# Patient Record
Sex: Female | Born: 1985 | Hispanic: Yes | Marital: Single
Health system: Southern US, Community
[De-identification: ages and names within clinical notes are randomized; demographics above are authoritative.]

---
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
## 2007-06-18 ENCOUNTER — Emergency Department: Payer: Self-pay | Admitting: Emergency Medicine

## 2007-06-30 ENCOUNTER — Inpatient Hospital Stay: Payer: Self-pay | Admitting: Vascular Surgery

## 2008-04-28 ENCOUNTER — Ambulatory Visit: Payer: Self-pay | Admitting: Family Medicine

## 2008-05-28 IMAGING — US ABDOMEN ULTRASOUND
1 series · 17 of 25 positions shown · non-contrast
Comparison: none

REASON FOR EXAM: RUQ pain
COMMENTS:

[Series 1: abdomen ultrasound · 17 of 51 slices shown]
[im 1/51]
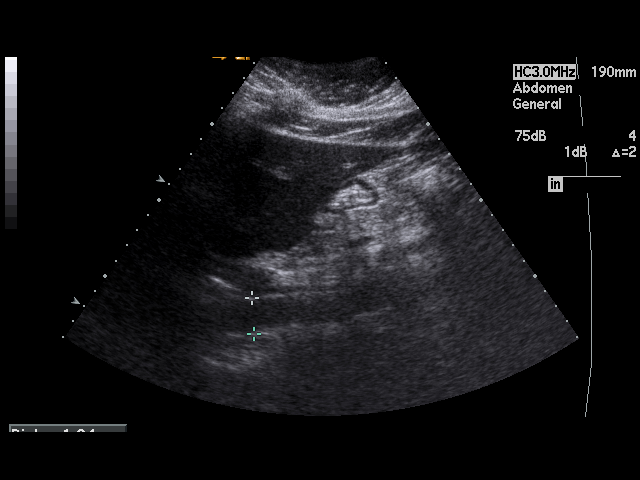
[im 5/51]
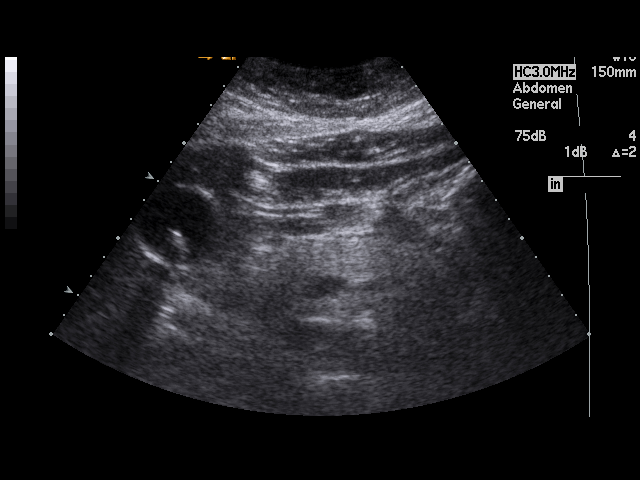
[im 7/51]
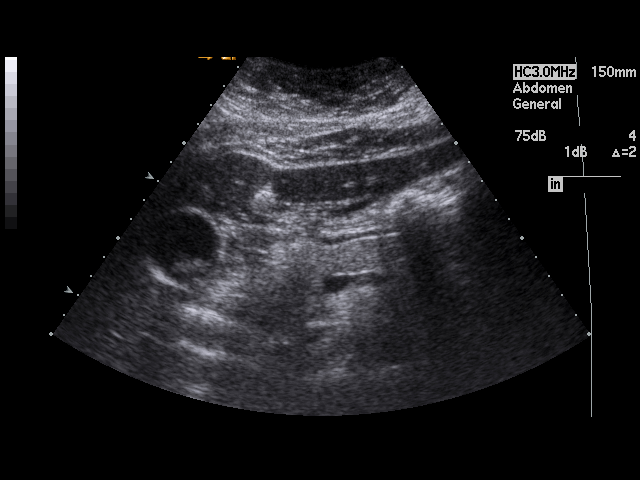
[im 11/51]
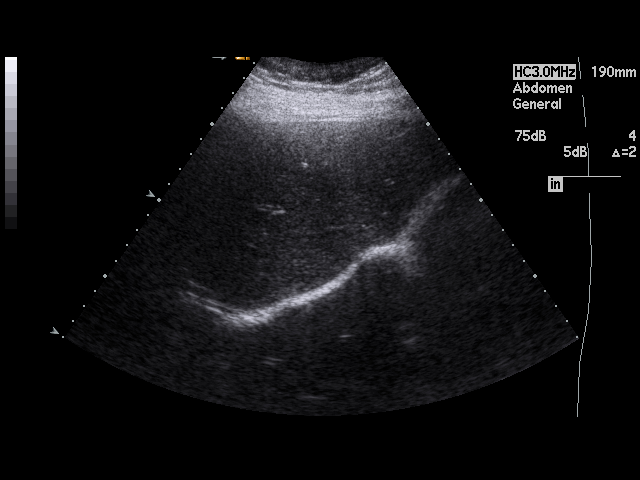
[im 13/51]
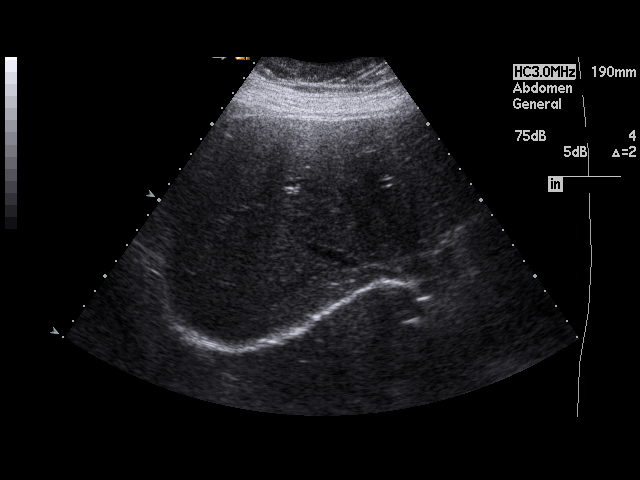
[im 17/51]
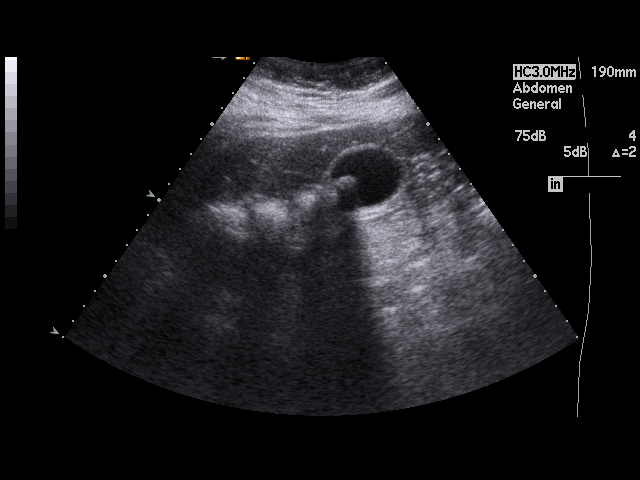
[im 19/51]
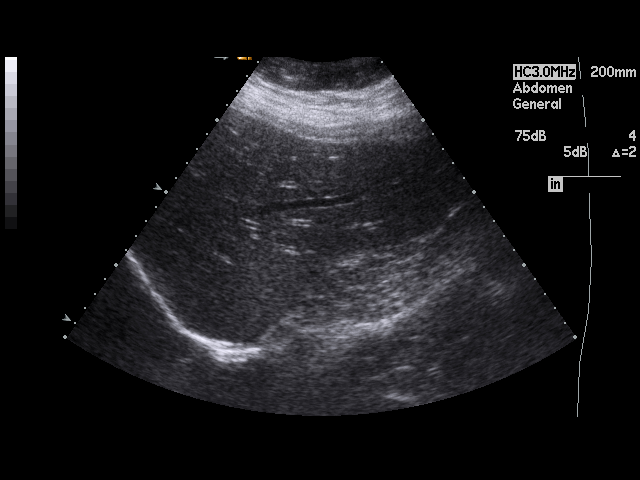
[im 23/51]
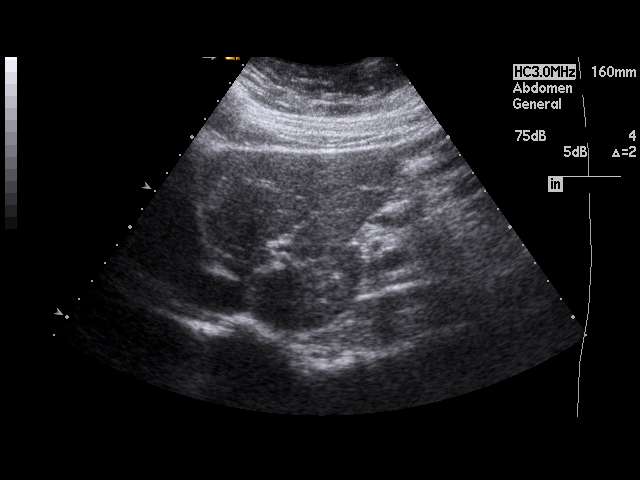
[im 26/51]
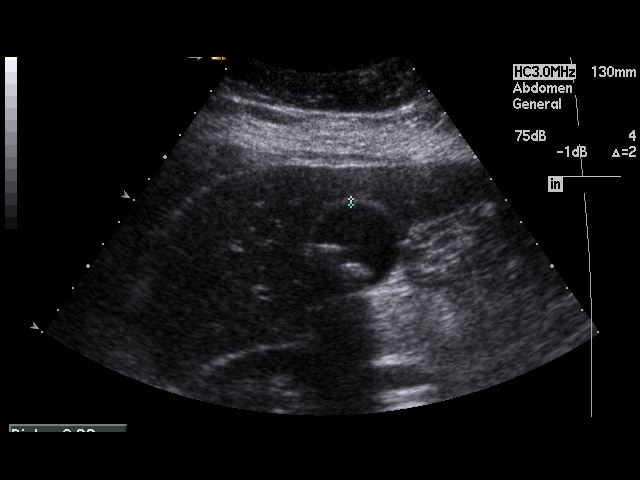
[im 28/51]
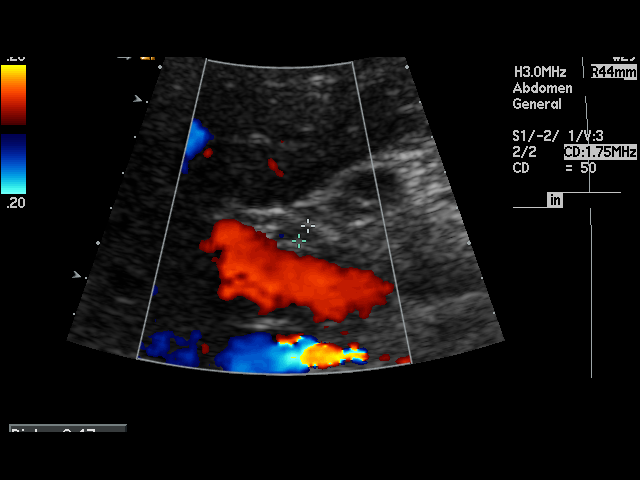
[im 32/51]
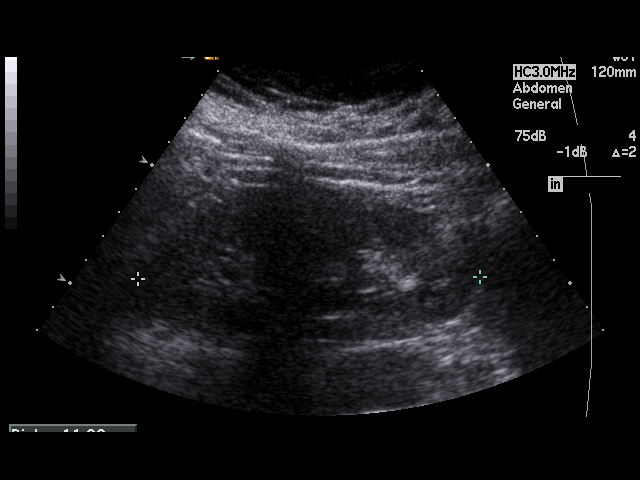
[im 34/51]
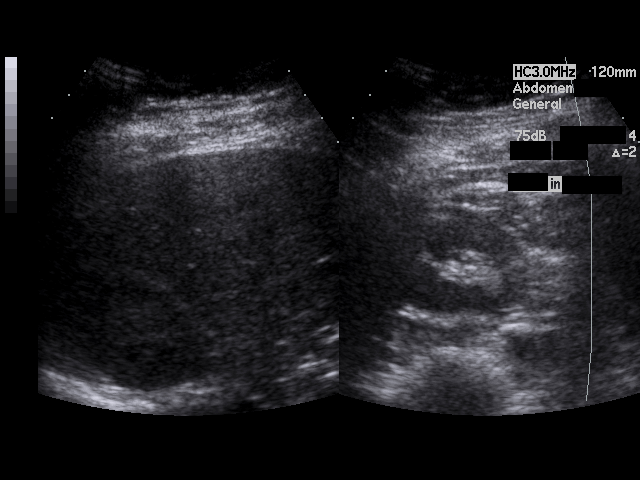
[im 38/51]
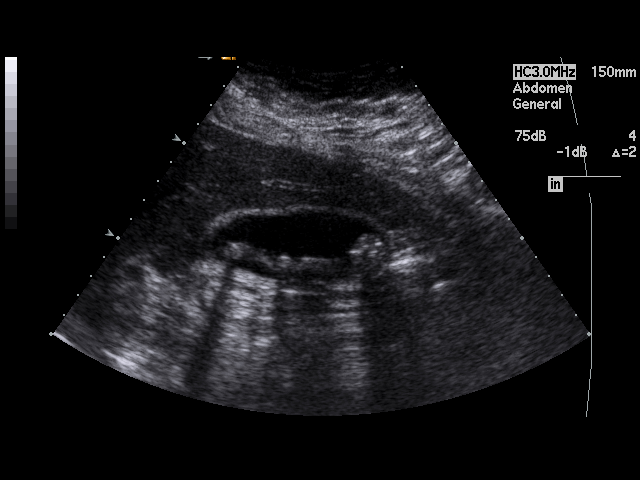
[im 40/51]
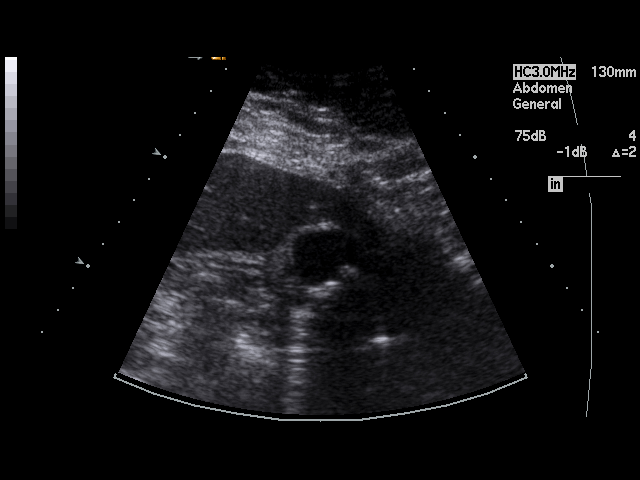
[im 44/51]
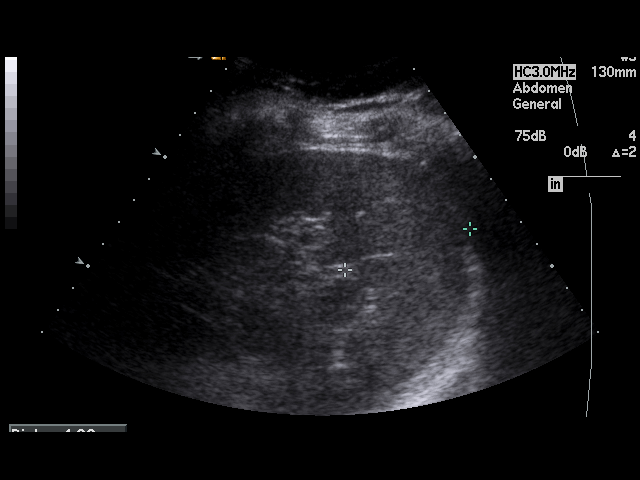
[im 46/51]
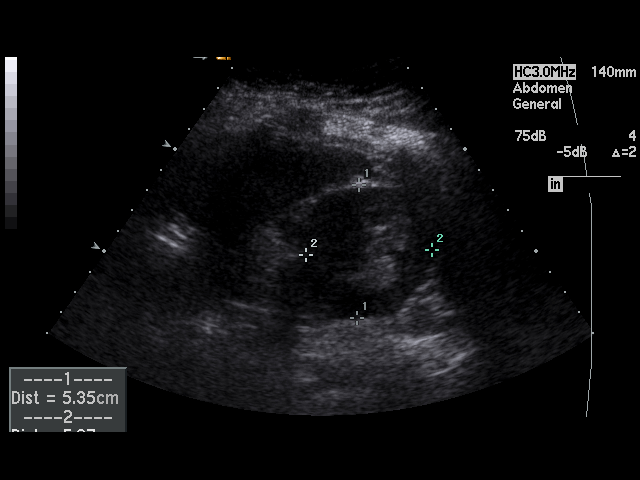
[im 51/51]
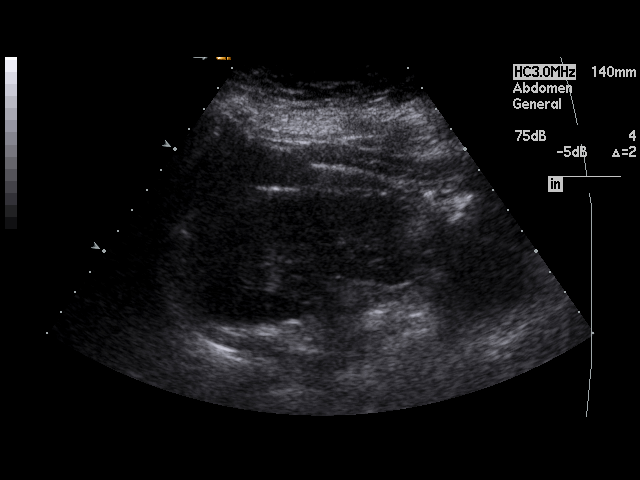

[17 of 25 positions shown; findings below may reference images not displayed]

PROCEDURE:     US  - US ABDOMEN GENERAL SURVEY  - June 18, 2007  [DATE]

RESULT:     There is no prior examination for comparison. The pancreas could
not be visualized because of excessive overlying bowel gas. The liver,
spleen and kidneys appear to be normal. The gallbladder has numerous
echogenic shadowing stones. The wall thickness is 2.2 mm. There is
reportedly a positive sonographic Murphy's sign. There is no pericholecystic
free fluid. The common bile duct diameter is 4.8 mm which is somewhat
prominent but near the upper limits of normal for the patient's age. The
possibility of a common bile duct stone without significant obstruction
cannot be completely excluded. Surgical consultation is suggested.
IMPRESSION: 1.     Cholelithiasis with a positive sonographic Murphy's sign. Surgical
consultation is recommended. The common bile duct is at the upper limits for
the patient's age and consideration for a common duct stone should be given.
There is no pericholecystic free fluid at this time.

## 2009-04-08 IMAGING — US US THYROID
1 series · 17 of 25 positions shown · non-contrast
Comparison: none

REASON FOR EXAM: thyromegaly
COMMENTS:

[Series 1: us thyroid · 17 of 26 slices shown]
[im 1/26]
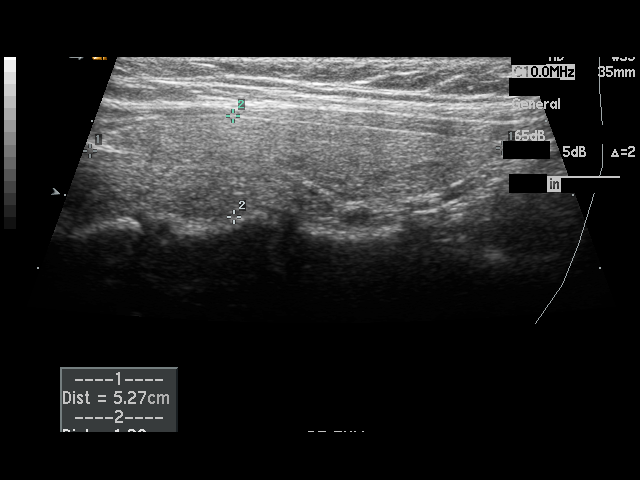
[im 3/26]
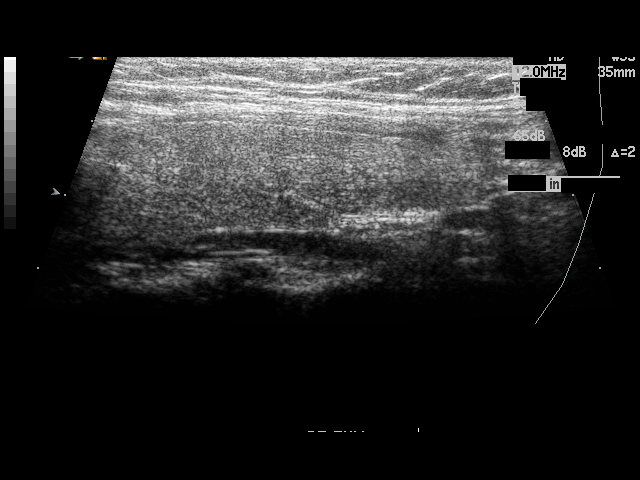
[im 4/26]
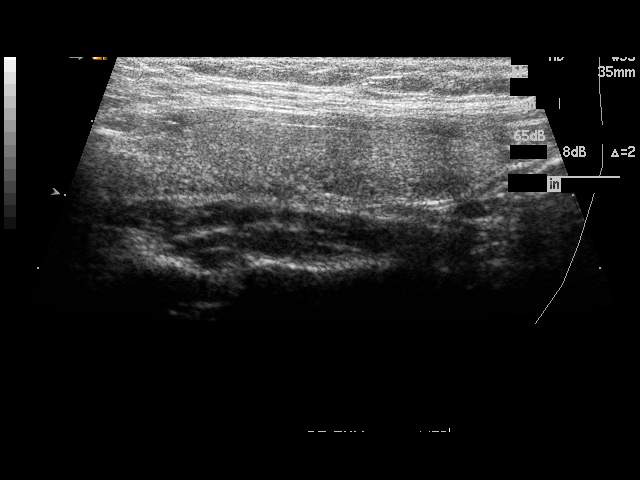
[im 6/26]
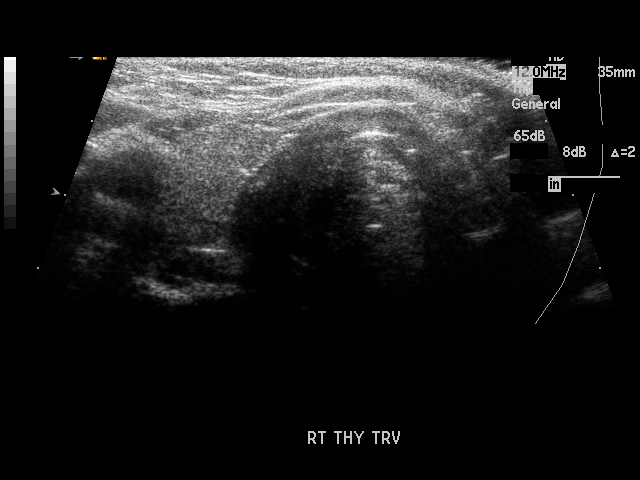
[im 7/26]
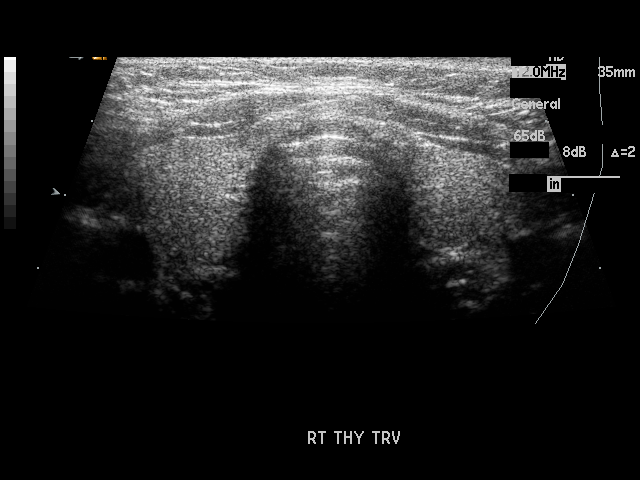
[im 9/26]
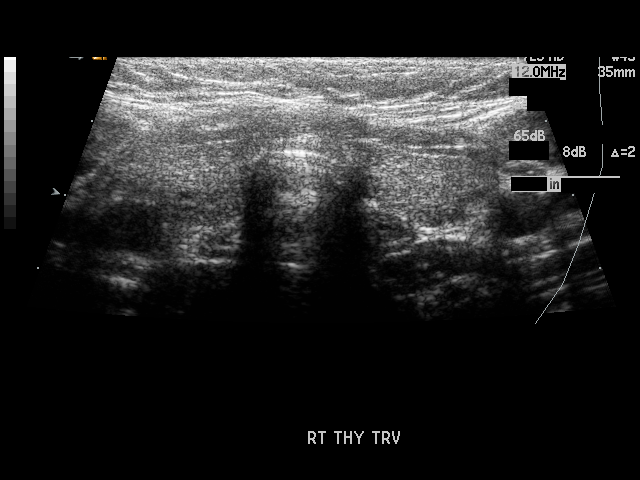
[im 10/26]
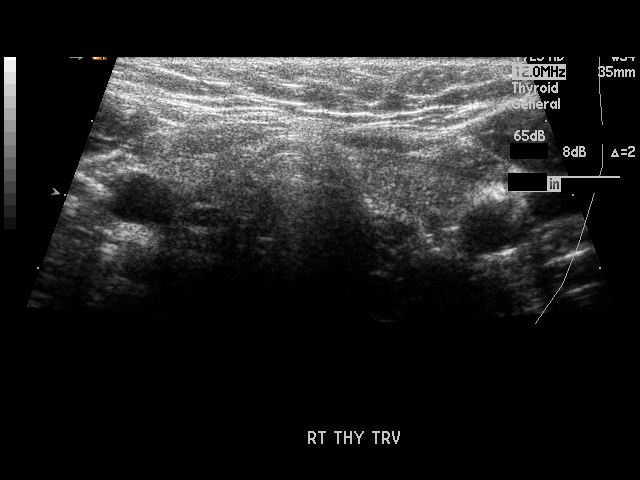
[im 12/26]
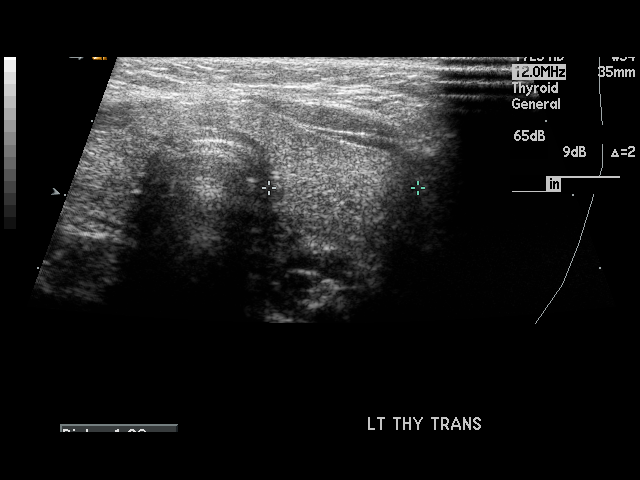
[im 13/26]
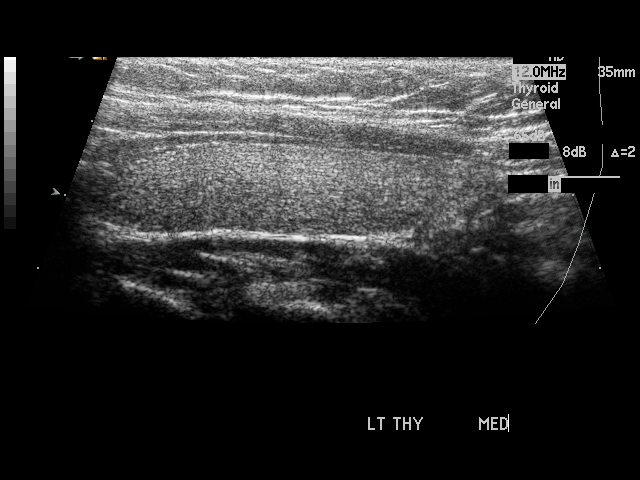
[im 14/26]
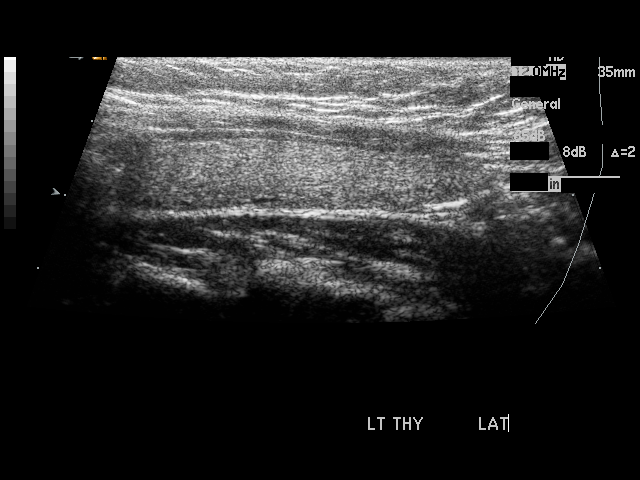
[im 16/26]
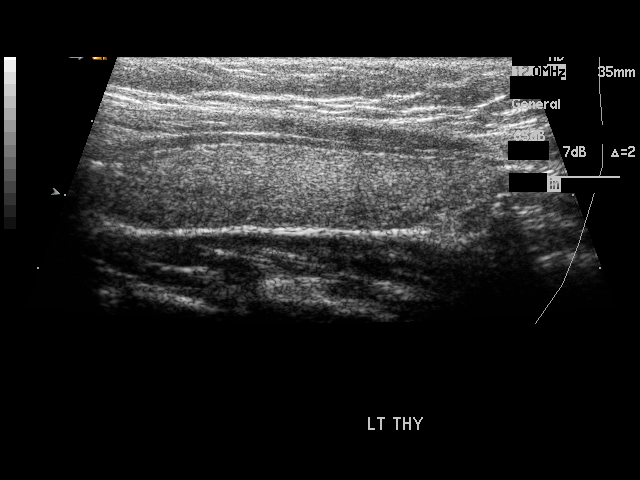
[im 17/26]
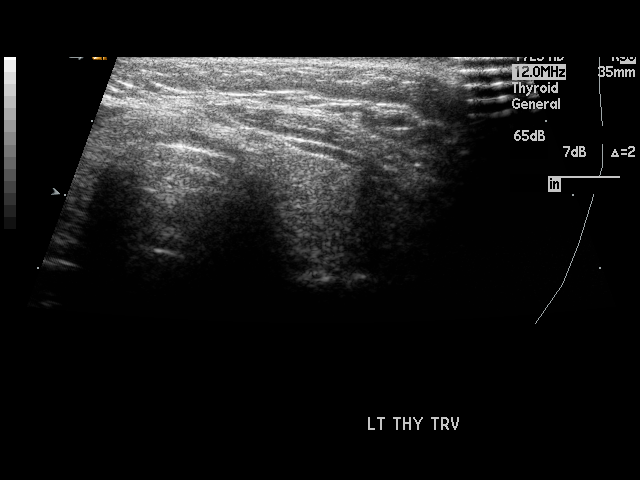
[im 19/26]
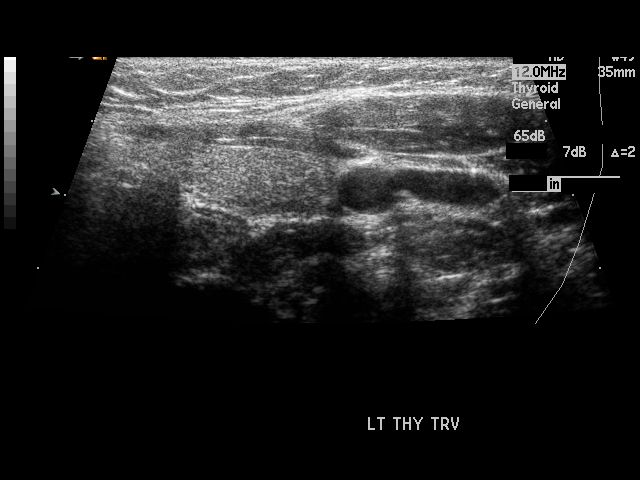
[im 20/26]
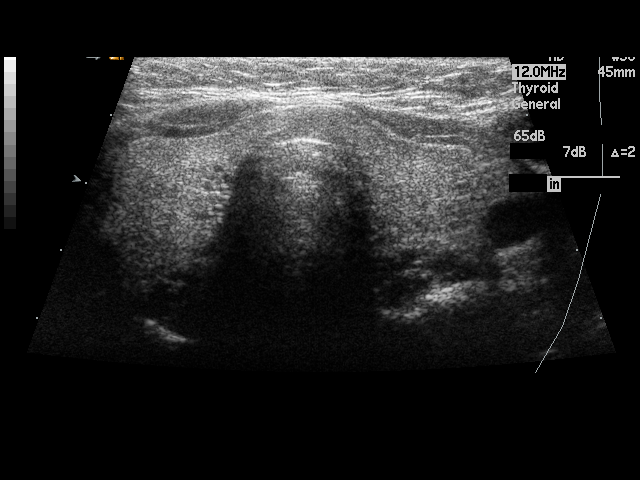
[im 22/26]
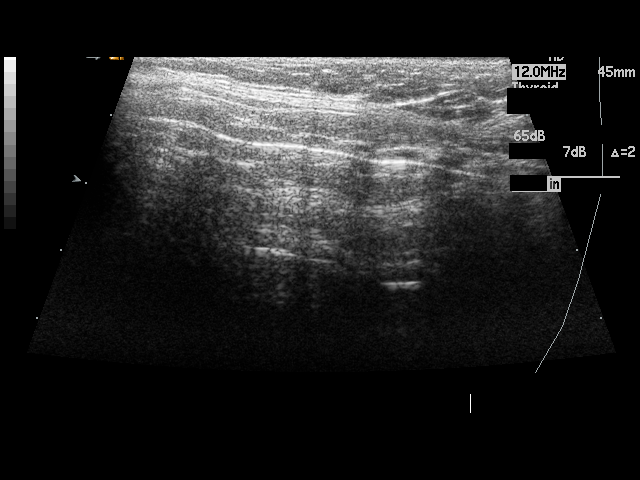
[im 23/26]
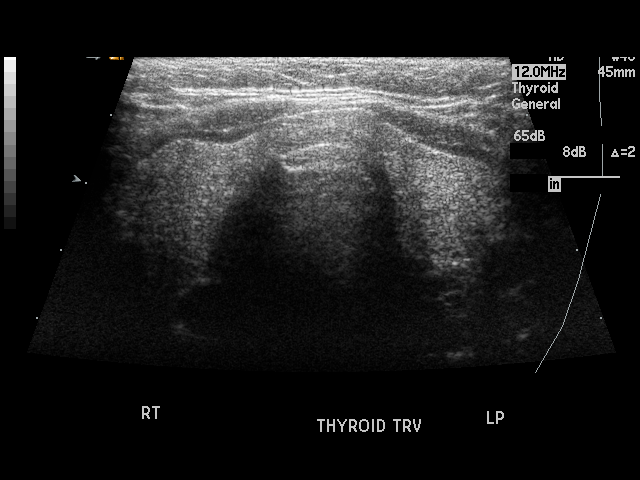
[im 26/26]
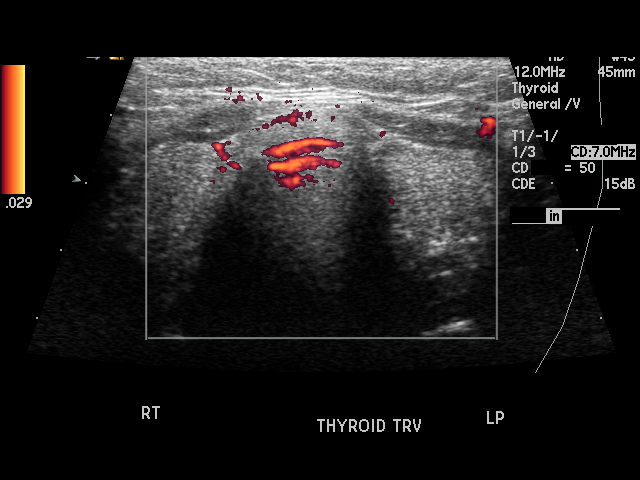

[17 of 25 positions shown; findings below may reference images not displayed]

PROCEDURE:     US  - US THYROID  - April 28, 2008 [DATE]

RESULT:     The RIGHT lobe of the thyroid measures 5.2 cm x 1.2 cm x 1.77 cm
and the LEFT lobe measures 5.2 cm x 1.2 cm x 1.9 cm.  The thyroid
echotexture bilaterally is homogeneous. No masses or nodules are identified.
No calcifications are seen.
IMPRESSION: 1. The thyroid lobes are upper limits for normal in size bilaterally. No
thyroid masses or nodules are identified.
2. The thyroid echotexture is homogeneous throughout.

## 2011-08-10 ENCOUNTER — Ambulatory Visit: Payer: Self-pay

## 2013-04-22 DIAGNOSIS — J45909 Unspecified asthma, uncomplicated: Secondary | ICD-10-CM | POA: Insufficient documentation

## 2013-04-22 DIAGNOSIS — F419 Anxiety disorder, unspecified: Secondary | ICD-10-CM | POA: Insufficient documentation

## 2014-02-28 ENCOUNTER — Inpatient Hospital Stay: Payer: Self-pay | Admitting: Psychiatry

## 2014-02-28 LAB — CBC
HCT: 43 % (ref 35.0–47.0)
HGB: 14.7 g/dL (ref 12.0–16.0)
MCH: 29.2 pg (ref 26.0–34.0)
MCHC: 34.1 g/dL (ref 32.0–36.0)
MCV: 86 fL (ref 80–100)
Platelet: 339 10*3/uL (ref 150–440)
RBC: 5.02 10*6/uL (ref 3.80–5.20)
RDW: 13.2 % (ref 11.5–14.5)
WBC: 10.9 10*3/uL (ref 3.6–11.0)

## 2014-02-28 LAB — URINALYSIS, COMPLETE
BLOOD: NEGATIVE
Bilirubin,UR: NEGATIVE
GLUCOSE, UR: NEGATIVE mg/dL (ref 0–75)
Ketone: NEGATIVE
Leukocyte Esterase: NEGATIVE
Nitrite: NEGATIVE
Ph: 5 (ref 4.5–8.0)
Protein: NEGATIVE
SPECIFIC GRAVITY: 1.025 (ref 1.003–1.030)

## 2014-02-28 LAB — COMPREHENSIVE METABOLIC PANEL
ALBUMIN: 4 g/dL (ref 3.4–5.0)
ALT: 32 U/L (ref 12–78)
AST: 19 U/L (ref 15–37)
Alkaline Phosphatase: 96 U/L
Anion Gap: 9 (ref 7–16)
BILIRUBIN TOTAL: 0.4 mg/dL (ref 0.2–1.0)
BUN: 7 mg/dL (ref 7–18)
CREATININE: 0.6 mg/dL (ref 0.60–1.30)
Calcium, Total: 8.9 mg/dL (ref 8.5–10.1)
Chloride: 103 mmol/L (ref 98–107)
Co2: 26 mmol/L (ref 21–32)
EGFR (African American): >60
EGFR (Non-African Amer.): >60
Glucose: 92 mg/dL (ref 65–99)
OSMOLALITY: 273 (ref 275–301)
POTASSIUM: 3.7 mmol/L (ref 3.5–5.1)
Sodium: 138 mmol/L (ref 136–145)
Total Protein: 8.4 g/dL — ABNORMAL HIGH (ref 6.4–8.2)

## 2014-02-28 LAB — DRUG SCREEN, URINE
AMPHETAMINES, UR SCREEN: NEGATIVE (ref ?–1000)
BARBITURATES, UR SCREEN: NEGATIVE (ref ?–200)
Benzodiazepine, Ur Scrn: NEGATIVE (ref ?–200)
Cannabinoid 50 Ng, Ur ~~LOC~~: NEGATIVE (ref ?–50)
Cocaine Metabolite,Ur ~~LOC~~: NEGATIVE (ref ?–300)
MDMA (ECSTASY) UR SCREEN: NEGATIVE (ref ?–500)
METHADONE, UR SCREEN: NEGATIVE (ref ?–300)
OPIATE, UR SCREEN: NEGATIVE (ref ?–300)
Phencyclidine (PCP) Ur S: NEGATIVE (ref ?–25)
Tricyclic, Ur Screen: NEGATIVE (ref ?–1000)

## 2014-02-28 LAB — ETHANOL
Ethanol %: <0.003 % (ref 0.000–0.080)
Ethanol: <3 mg/dL

## 2014-02-28 LAB — SALICYLATE LEVEL: Salicylates, Serum: <1.7 mg/dL

## 2014-02-28 LAB — ACETAMINOPHEN LEVEL
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
## 2021-02-05 ENCOUNTER — Ambulatory Visit: Payer: Self-pay | Admitting: Podiatry

## 2021-02-05 ENCOUNTER — Ambulatory Visit: Payer: BC Managed Care – PPO | Admitting: Podiatry

## 2021-02-05 ENCOUNTER — Other Ambulatory Visit: Payer: Self-pay

## 2021-02-05 ENCOUNTER — Encounter: Payer: Self-pay | Admitting: Podiatry

## 2021-02-05 ENCOUNTER — Ambulatory Visit (INDEPENDENT_AMBULATORY_CARE_PROVIDER_SITE_OTHER): Payer: BC Managed Care – PPO

## 2021-02-05 DIAGNOSIS — M722 Plantar fascial fibromatosis: Secondary | ICD-10-CM

## 2021-02-05 NOTE — Progress Notes (Signed)
  Subjective:  Patient ID: Jackie Wilkins, female    DOB: 1986/06/30,  MRN: [id number]  No chief complaint on file.   35 y.o. female presents with the above complaint.  Patient presents with complaint left heel pain that has been going on for quite some time.  Patient states that is 10 out of 10 in the morning.  Patient states the pain is really sharp and intense.  Especially when taking a for step.  She has not tried any treatment options for it.  She would like to discuss treatment options.  She has not seen anyone as prior to see me.  She denies any other acute complaints.   Review of Systems: Negative except as noted in the HPI. Denies N/V/F/Ch.  No past medical history on file. No current outpatient medications on file.  Social History   Tobacco Use  Smoking Status Not on file  Smokeless Tobacco Not on file    Not on File Objective:  There were no vitals filed for this visit. There is no height or weight on file to calculate BMI. Constitutional Well developed. Well nourished.  Vascular Dorsalis pedis pulses palpable bilaterally. Posterior tibial pulses palpable bilaterally. Capillary refill normal to all digits.  No cyanosis or clubbing noted. Pedal hair growth normal.  Neurologic Normal speech. Oriented to person, place, and time. Epicritic sensation to light touch grossly present bilaterally.  Dermatologic Nails well groomed and normal in appearance. No open wounds. No skin lesions.  Orthopedic: Normal joint ROM without pain or crepitus bilaterally. No visible deformities. Tender to palpation at the calcaneal tuber left. No pain with calcaneal squeeze left. Ankle ROM diminished range of motion left. Silfverskiold Test: positive left.   Radiographs: Taken and reviewed. No acute fractures or dislocations. No evidence of stress fracture.  Plantar heel spur present. Posterior heel spur present.   Assessment:   1. Plantar fasciitis of left foot    Plan:   Patient was evaluated and treated and all questions answered.  Plantar Fasciitis, left - XR reviewed as above.  - Educated on icing and stretching. Instructions given.  - Injection delivered to the plantar fascia as below. - DME: Plantar Fascial Brace - Pharmacologic management: None  Procedure: Injection Tendon/Ligament Location: Left plantar fascia at the glabrous junction; medial approach. Skin Prep: alcohol Injectate: 0.5 cc 0.5% marcaine plain, 0.5 cc of 1% Lidocaine, 0.5 cc kenalog 10. Disposition: Patient tolerated procedure well. Injection site dressed with a band-aid.  No follow-ups on file.

## 2021-03-19 ENCOUNTER — Ambulatory Visit: Payer: BC Managed Care – PPO | Admitting: Podiatry

## 2021-03-19 ENCOUNTER — Other Ambulatory Visit: Payer: Self-pay

## 2021-03-19 DIAGNOSIS — Q666 Other congenital valgus deformities of feet: Secondary | ICD-10-CM | POA: Diagnosis not present

## 2021-03-19 DIAGNOSIS — M722 Plantar fascial fibromatosis: Secondary | ICD-10-CM | POA: Diagnosis not present

## 2021-03-19 NOTE — Progress Notes (Signed)
3

## 2021-03-21 ENCOUNTER — Encounter: Payer: Self-pay | Admitting: Podiatry

## 2021-03-21 NOTE — Progress Notes (Signed)
Subjective:  Patient ID: Jackie Wilkins, female    DOB: 04-02-1986,  MRN: [id number]  Chief Complaint  Patient presents with   Plantar Fasciitis    PT stated that she is still having some pain in both feet she stated that the injection and brace did help with the pain     35 y.o. female presents with the above complaint.  Patient presents with follow-up of left Planter fasciitis.  She states the right side started to hurt now.  She would like to discuss treatment options for both.  She has been wearing her brace the injection did help.  He states she is about 50% relief.  She would like to know if she needs to do another injection.  She denies any other acute complaints she would like to obtain orthotics as well.   Review of Systems: Negative except as noted in the HPI. Denies N/V/F/Ch.  No past medical history on file. No current outpatient medications on file.  Social History   Tobacco Use  Smoking Status Not on file  Smokeless Tobacco Not on file    Not on File Objective:  There were no vitals filed for this visit. There is no height or weight on file to calculate BMI. Constitutional Well developed. Well nourished.  Vascular Dorsalis pedis pulses palpable bilaterally. Posterior tibial pulses palpable bilaterally. Capillary refill normal to all digits.  No cyanosis or clubbing noted. Pedal hair growth normal.  Neurologic Normal speech. Oriented to person, place, and time. Epicritic sensation to light touch grossly present bilaterally.  Dermatologic Nails well groomed and normal in appearance. No open wounds. No skin lesions.  Orthopedic: Normal joint ROM without pain or crepitus bilaterally. No visible deformities. Tender to palpation at the calcaneal tuber left. No pain with calcaneal squeeze left. Ankle ROM diminished range of motion left. Silfverskiold Test: positive left.  Tender to palpation at the calcaneal tuber right No pain with calcaneal squeeze  right Ankle ROM diminished range of motion right Silfverskiold Test: positive right   Radiographs: Taken and reviewed. No acute fractures or dislocations. No evidence of stress fracture.  Plantar heel spur present. Posterior heel spur present.   Assessment:   1. Pes planovalgus   2. Plantar fasciitis of left foot   3. Plantar fasciitis of right foot     Plan:  Patient was evaluated and treated and all questions answered.  Plantar Fasciitis, right - XR reviewed as above.  - Educated on icing and stretching. Instructions given.  - Injection delivered to the plantar fascia as below. - DME: Night splint - Pharmacologic management: None  Procedure: Injection Tendon/Ligament Location: Left plantar fascia at the glabrous junction; medial approach. Skin Prep: alcohol Injectate: 0.5 cc 0.5% marcaine plain, 0.5 cc of 1% Lidocaine, 0.5 cc kenalog 10. Disposition: Patient tolerated procedure well. Injection site dressed with a band-aid.  Plantar Fasciitis, left - XR reviewed as above.  - Educated on icing and stretching. Instructions given.  -Second injection delivered to the plantar fascia as below. - DME: Night splint - Pharmacologic management: None  Pes planovalgus -I splinted patient the etiology of pes planovalgus in the setting of Planter fasciitis and various treatment options were extensively discussed.  Given the amount of pain that she is having's I believe patient will benefit from custom-made orthotics to control the hindfoot motion support the arch of the foot take the pressure away from plantar fascial in the arch. -She was casted for orthotics  Procedure: Injection Tendon/Ligament Location: Left plantar  fascia at the glabrous junction; medial approach. Skin Prep: alcohol Injectate: 0.5 cc 0.5% marcaine plain, 0.5 cc of 1% Lidocaine, 0.5 cc kenalog 10. Disposition: Patient tolerated procedure well. Injection site dressed with a band-aid.  No follow-ups on file.

## 2021-03-25 ENCOUNTER — Telehealth: Payer: Self-pay

## 2021-03-25 NOTE — Telephone Encounter (Signed)
Pt called back and stated that the pop came from the bottom of her foot. She stated that she can't put weight on it and is not able to wear the brace due to swelling and pain.

## 2021-03-25 NOTE — Telephone Encounter (Signed)
Patient called and left a message. She felt something pop in her left foot this morning while walking her dog. It is painful. Please call to discuss/advise  Thanks

## 2021-03-26 ENCOUNTER — Telehealth: Payer: Self-pay | Admitting: Podiatry

## 2021-03-26 NOTE — Telephone Encounter (Signed)
Patient called stating she called yesterday about an injury. Pt states Dr Allena Katz called her back but she missed his call. Pt is following up to see if someone can call her back.

## 2021-04-23 ENCOUNTER — Ambulatory Visit: Payer: BC Managed Care – PPO | Admitting: Podiatry

## 2021-04-25 ENCOUNTER — Other Ambulatory Visit: Payer: BC Managed Care – PPO

## 2021-04-30 ENCOUNTER — Ambulatory Visit: Payer: BC Managed Care – PPO | Admitting: Podiatry

## 2021-04-30 ENCOUNTER — Encounter: Payer: Self-pay | Admitting: Podiatry

## 2021-05-03 NOTE — Telephone Encounter (Signed)
Called pt LM on VM for her to call the office to scheduyle an appt.

## 2021-05-07 ENCOUNTER — Ambulatory Visit (INDEPENDENT_AMBULATORY_CARE_PROVIDER_SITE_OTHER): Payer: BC Managed Care – PPO | Admitting: Podiatry

## 2021-05-07 ENCOUNTER — Other Ambulatory Visit: Payer: Self-pay

## 2021-05-07 DIAGNOSIS — M722 Plantar fascial fibromatosis: Secondary | ICD-10-CM

## 2021-05-07 DIAGNOSIS — Q666 Other congenital valgus deformities of feet: Secondary | ICD-10-CM

## 2021-05-07 NOTE — Progress Notes (Signed)
  Subjective:  Patient ID: Jackie Wilkins, female    DOB: Aug 10, 1986,  MRN: [id number]  Chief Complaint  Patient presents with   Foot Pain    PT stated that she has some improvement most of her pain is mainly in the morning     35 y.o. female presents with the above complaint.  Patient presents for follow-up of bilateral plantar fasciitis.  She is due states that she is doing a lot better.  She is here to pick up orthotics as well.  She denies any other acute complaints.  Pain has improved considerably.  Review of Systems: Negative except as noted in the HPI. Denies N/V/F/Ch.  No past medical history on file. No current outpatient medications on file.  Social History   Tobacco Use  Smoking Status Not on file  Smokeless Tobacco Not on file    Not on File Objective:  There were no vitals filed for this visit. There is no height or weight on file to calculate BMI. Constitutional Well developed. Well nourished.  Vascular Dorsalis pedis pulses palpable bilaterally. Posterior tibial pulses palpable bilaterally. Capillary refill normal to all digits.  No cyanosis or clubbing noted. Pedal hair growth normal.  Neurologic Normal speech. Oriented to person, place, and time. Epicritic sensation to light touch grossly present bilaterally.  Dermatologic Nails well groomed and normal in appearance. No open wounds. No skin lesions.  Orthopedic: Normal joint ROM without pain or crepitus bilaterally. No visible deformities. No further tender to palpation at the calcaneal tuber left. No pain with calcaneal squeeze left. Ankle ROM diminished range of motion left. Silfverskiold Test: positive left.  No further tender to palpation at the calcaneal tuber right No pain with calcaneal squeeze right Ankle ROM diminished range of motion right Silfverskiold Test: positive right   Radiographs: Taken and reviewed. No acute fractures or dislocations. No evidence of stress fracture.   Plantar heel spur present. Posterior heel spur present.   Assessment:   1. Plantar fasciitis of left foot   2. Plantar fasciitis of right foot   3. Pes planovalgus      Plan:  Patient was evaluated and treated and all questions answered.  Plantar Fasciitis, right -Clinically healed.  I discussed shoe gear modification in extensive detail.  I discussed the usage of orthotics.  If any foot and ankle issues arise in future I have asked her to come see me.  She states understanding   Plantar Fasciitis, left -Clinically healed  Pes planovalgus -I splinted patient the etiology of pes planovalgus in the setting of Planter fasciitis and various treatment options were extensively discussed.  Given the amount of pain that she is having's I believe patient will benefit from custom-made orthotics to control the hindfoot motion support the arch of the foot take the pressure away from plantar fascial in the arch. -Orthotics were dispensed and functioning well.    No follow-ups on file.

## 2021-06-26 ENCOUNTER — Telehealth: Payer: Self-pay | Admitting: Podiatry

## 2021-06-26 ENCOUNTER — Telehealth: Payer: Self-pay | Admitting: *Deleted

## 2021-06-26 NOTE — Telephone Encounter (Signed)
"  I'm a patient of Dr. Patel.  I've been seeing him for Plantar Fasciitis.  I'm still having a lot of pain, even wearing the orthotic.  Is there anything oral that I can take for it?  Please give me a call." 

## 2021-06-26 NOTE — Telephone Encounter (Signed)
[address] [place] [id number]  at [facility]

## 2021-06-26 NOTE — Telephone Encounter (Signed)
"  I'm a patient of Dr. Allena Katz.  I've been seeing him for Plantar Fasciitis.  I'm still having a lot of pain, even wearing the orthotic.  Is there anything oral that I can take for it?  Please give me a call."

## 2021-06-27 MED ORDER — METHYLPREDNISOLONE 4 MG PO TBPK: ORAL_TABLET | ORAL | 0 refills | Status: AC

## 2021-06-27 MED ORDER — MELOXICAM 15 MG PO TABS: 15.0000 mg | ORAL_TABLET | Freq: Every day | ORAL | 0 refills | Status: DC

## 2021-07-31 ENCOUNTER — Other Ambulatory Visit: Payer: Self-pay | Admitting: Podiatry

## 2021-07-31 NOTE — Telephone Encounter (Signed)
Please advise 

## 2021-09-09 ENCOUNTER — Telehealth: Payer: Self-pay | Admitting: Podiatry

## 2021-09-09 NOTE — Telephone Encounter (Signed)
She call and ask for a Reill  on meloxicam (MOBIC) 15 MG tablet

## 2021-09-10 ENCOUNTER — Other Ambulatory Visit: Payer: Self-pay | Admitting: Podiatry

## 2021-09-10 MED ORDER — MELOXICAM 15 MG PO TABS: 15.0000 mg | ORAL_TABLET | Freq: Every day | ORAL | 0 refills | Status: DC

## 2021-10-21 ENCOUNTER — Other Ambulatory Visit: Payer: Self-pay | Admitting: Podiatry

## 2021-11-18 ENCOUNTER — Other Ambulatory Visit: Payer: Self-pay | Admitting: Podiatry

## 2021-11-28 ENCOUNTER — Telehealth: Payer: Self-pay | Admitting: Podiatry

## 2021-11-28 NOTE — Telephone Encounter (Signed)
Pt left message asking about getting her meloxicam refilled. If you could sent it to [facility] in [place].. If any problems please call pt and let her know. ? ? ?

## 2021-11-29 NOTE — Telephone Encounter (Signed)
Left message for pt that I have forwarded the message about the refill to Dr Allena Katz but he is out of the office yesterday afternoon and today so it maybe next week to get the refill. I also forwarded it to the doctor on call to see if he would fill the rx. ?

## 2021-11-30 ENCOUNTER — Other Ambulatory Visit: Payer: Self-pay | Admitting: Podiatry

## 2021-11-30 MED ORDER — MELOXICAM 15 MG PO TABS: 15.0000 mg | ORAL_TABLET | Freq: Every day | ORAL | 0 refills | Status: DC

## 2021-11-30 NOTE — Telephone Encounter (Signed)
Refill sent. - Dr. Cianni Manny

## 2021-12-02 NOTE — Telephone Encounter (Signed)
Lvm for pt that medication was called in to the [facility]. ?

## 2021-12-25 ENCOUNTER — Other Ambulatory Visit: Payer: Self-pay | Admitting: Podiatry

## 2021-12-26 NOTE — Telephone Encounter (Signed)
Please Advise

## 2022-01-29 ENCOUNTER — Other Ambulatory Visit: Payer: Self-pay | Admitting: Podiatry

## 2022-02-24 ENCOUNTER — Other Ambulatory Visit: Payer: Self-pay | Admitting: Podiatry

## 2022-03-26 ENCOUNTER — Other Ambulatory Visit: Payer: Self-pay | Admitting: Podiatry

## 2022-06-05 ENCOUNTER — Ambulatory Visit
Admission: RE | Admit: 2022-06-05 | Discharge: 2022-06-05 | Disposition: A | Discharge status: Home / Self Care | Payer: 59 | Source: Ambulatory Visit | Attending: Physician Assistant | Admitting: Physician Assistant

## 2022-06-05 ENCOUNTER — Other Ambulatory Visit: Payer: Self-pay | Admitting: Physician Assistant

## 2022-06-05 DIAGNOSIS — R1031 Right lower quadrant pain: Secondary | ICD-10-CM | POA: Diagnosis present

## 2022-07-02 ENCOUNTER — Ambulatory Visit
Admission: EM | Admit: 2022-07-02 | Discharge: 2022-07-02 | Disposition: A | Discharge status: Home / Self Care | Payer: 59

## 2022-07-02 DIAGNOSIS — R21 Rash and other nonspecific skin eruption: Secondary | ICD-10-CM

## 2022-07-02 DIAGNOSIS — K219 Gastro-esophageal reflux disease without esophagitis: Secondary | ICD-10-CM | POA: Insufficient documentation

## 2022-07-02 MED ORDER — PREDNISONE 50 MG PO TABS: 50.0000 mg | ORAL_TABLET | Freq: Every day | ORAL | 0 refills | Status: AC

## 2022-07-02 MED ORDER — METHYLPREDNISOLONE ACETATE 40 MG/ML IJ SUSP
60.0000 mg | Freq: Once | INTRAMUSCULAR | Status: AC
  Administered 2022-07-02: 60 mg via INTRAMUSCULAR

## 2022-07-02 NOTE — Discharge Instructions (Addendum)
Follow up with your primary care or allergy provider if your symptoms are worsening or not improving with treatment.

## 2022-07-02 NOTE — ED Triage Notes (Signed)
Pt. Presents to UC w/ c/o an allergic reaction that presents as red splotches on her face. Pt. States she is experiencing itching on her face and eyes. Pt. Also states she tried a new acne cream and was exposed to flowers she may have been allergic to.

## 2022-07-02 NOTE — ED Provider Notes (Signed)
Jackie Wilkins    CSN: [id number] Arrival date & time: 07/02/22  1520      History   Chief Complaint Chief Complaint  Patient presents with   Allergic Reaction    Entered by patient    HPI Jackie Wilkins is a 36 y.o. female.    Allergic Reaction   Presents to UC with complaint of allergic reaction.  She reports "red splotches" on her face.  She complains of itching on her face and eyes.  She states she is using a new acne cream and also reports exposure to some flowers that she feels she may have an allergy to.  Rash is on her forehead, bilateral cheeks, she states rashes in her ear canals.  She endorses pain in her eyes.  No past medical history on file.  There are no problems to display for this patient.   No past surgical history on file.  OB History   No obstetric history on file.      Home Medications    Prior to Admission medications   Medication Sig Start Date End Date Taking? Authorizing Provider  meloxicam (MOBIC) 15 MG tablet TAKE ONE TABLET BY MOUTH ONE TIME DAILY 07/31/21   Felipa Furnace, DPM  meloxicam (MOBIC) 15 MG tablet TAKE ONE TABLET BY MOUTH ONE TIME DAILY 01/29/22   Edrick Kins, DPM  methylPREDNISolone (MEDROL DOSEPAK) 4 MG TBPK tablet Use as directed 06/27/21   Felipa Furnace, DPM    Family History No family history on file.  Social History     Allergies   Patient has no allergy information on record.   Review of Systems Review of Systems   Physical Exam Triage Vital Signs ED Triage Vitals  Enc Vitals Group     BP      Pulse      Resp      Temp      Temp src      SpO2      Weight      Height      Head Circumference      Peak Flow      Pain Score      Pain Loc      Pain Edu?      Excl. in [place]?    No data found.  Updated Vital Signs LMP 05/02/2022   Visual Acuity Right Eye Distance:   Left Eye Distance:   Bilateral Distance:    Right Eye Near:   Left Eye Near:    Bilateral Near:      Physical Exam HENT:     Head:       UC Treatments / Results  Labs (all labs ordered are listed, but only abnormal results are displayed) Labs Reviewed - No data to display  EKG   Radiology No results found.  Procedures Procedures (including critical care time)  Medications Ordered in UC Medications - No data to display  Initial Impression / Assessment and Plan / UC Course  I have reviewed the triage vital signs and the nursing notes.  Pertinent labs & imaging results that were available during my care of the patient were reviewed by me and considered in my medical decision making (see chart for details).   Likely allergic reaction to adapalene which she has used topically.  Will treat with corticosteroid providing first dose in clinic.   Final Clinical Impressions(s) / UC Diagnoses   Final diagnoses:  None   Discharge Instructions  None    ED Prescriptions   None    PDMP not reviewed this encounter.   Rose Phi, [place] 07/02/22 1621

## 2022-07-14 ENCOUNTER — Other Ambulatory Visit: Payer: Self-pay | Admitting: Podiatry

## 2022-07-26 ENCOUNTER — Other Ambulatory Visit: Payer: Self-pay | Admitting: Podiatry

## 2023-05-29 ENCOUNTER — Other Ambulatory Visit: Payer: Self-pay | Admitting: Family Medicine

## 2023-05-29 DIAGNOSIS — Z807 Family history of other malignant neoplasms of lymphoid, hematopoietic and related tissues: Secondary | ICD-10-CM

## 2023-05-29 DIAGNOSIS — J4540 Moderate persistent asthma, uncomplicated: Secondary | ICD-10-CM
